# Patient Record
Sex: Male | Born: 1985 | Race: Black or African American | Marital: Married | Smoking: Current every day smoker
Health system: Southern US, Community
[De-identification: ages and names within clinical notes are randomized; demographics above are authoritative.]

## PROBLEM LIST (undated history)

## (undated) DIAGNOSIS — M549 Dorsalgia, unspecified: Secondary | ICD-10-CM

---

## 2015-09-22 ENCOUNTER — Encounter: Payer: Self-pay | Admitting: Emergency Medicine

## 2015-09-22 ENCOUNTER — Emergency Department
Admission: EM | Admit: 2015-09-22 | Discharge: 2015-09-22 | Disposition: A | Discharge status: Home / Self Care | Payer: No Typology Code available for payment source | Attending: Emergency Medicine | Admitting: Emergency Medicine

## 2015-09-22 ENCOUNTER — Emergency Department: Payer: No Typology Code available for payment source

## 2015-09-22 DIAGNOSIS — F17200 Nicotine dependence, unspecified, uncomplicated: Secondary | ICD-10-CM | POA: Diagnosis not present

## 2015-09-22 DIAGNOSIS — Y939 Activity, unspecified: Secondary | ICD-10-CM | POA: Diagnosis not present

## 2015-09-22 DIAGNOSIS — S39012A Strain of muscle, fascia and tendon of lower back, initial encounter: Secondary | ICD-10-CM | POA: Diagnosis not present

## 2015-09-22 DIAGNOSIS — M545 Low back pain: Secondary | ICD-10-CM | POA: Diagnosis present

## 2015-09-22 DIAGNOSIS — S161XXA Strain of muscle, fascia and tendon at neck level, initial encounter: Secondary | ICD-10-CM

## 2015-09-22 DIAGNOSIS — Y999 Unspecified external cause status: Secondary | ICD-10-CM | POA: Insufficient documentation

## 2015-09-22 DIAGNOSIS — Y92410 Unspecified street and highway as the place of occurrence of the external cause: Secondary | ICD-10-CM | POA: Diagnosis not present

## 2015-09-22 MED ORDER — CYCLOBENZAPRINE HCL 10 MG PO TABS: 10.0000 mg | ORAL_TABLET | Freq: Three times a day (TID) | ORAL | 0 refills | Status: DC | PRN

## 2015-09-22 MED ORDER — NAPROXEN 500 MG PO TABS: 500.0000 mg | ORAL_TABLET | Freq: Two times a day (BID) | ORAL | 0 refills | Status: DC
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
## 2015-09-22 NOTE — ED Notes (Signed)
Patient returned from xray.
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
## 2015-11-04 ENCOUNTER — Encounter: Payer: Self-pay | Admitting: *Deleted

## 2015-11-04 ENCOUNTER — Emergency Department
Admission: EM | Admit: 2015-11-04 | Discharge: 2015-11-04 | Disposition: A | Discharge status: Home / Self Care | Payer: No Typology Code available for payment source | Attending: Emergency Medicine | Admitting: Emergency Medicine

## 2015-11-04 DIAGNOSIS — M545 Low back pain, unspecified: Secondary | ICD-10-CM

## 2015-11-04 DIAGNOSIS — Y92410 Unspecified street and highway as the place of occurrence of the external cause: Secondary | ICD-10-CM | POA: Diagnosis not present

## 2015-11-04 DIAGNOSIS — F17200 Nicotine dependence, unspecified, uncomplicated: Secondary | ICD-10-CM | POA: Diagnosis not present

## 2015-11-04 DIAGNOSIS — Y999 Unspecified external cause status: Secondary | ICD-10-CM | POA: Diagnosis not present

## 2015-11-04 DIAGNOSIS — Y939 Activity, unspecified: Secondary | ICD-10-CM | POA: Insufficient documentation

## 2015-11-04 MED ORDER — BACLOFEN 10 MG PO TABS: 10.0000 mg | ORAL_TABLET | Freq: Three times a day (TID) | ORAL | 0 refills | Status: AC

## 2015-11-04 MED ORDER — NAPROXEN 500 MG PO TABS: 500.0000 mg | ORAL_TABLET | Freq: Two times a day (BID) | ORAL | 0 refills | Status: AC

## 2015-11-04 MED ORDER — NAPROXEN 500 MG PO TABS
500.0000 mg | ORAL_TABLET | Freq: Once | ORAL | Status: AC
  Administered 2015-11-04: 500 mg via ORAL
  Filled 2015-11-04: qty 1
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
## 2015-11-04 NOTE — ED Notes (Signed)
Pt given flexeril and naprosyn prescriptions in past but reports he can not afford medication at this time.
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
## 2015-12-03 ENCOUNTER — Encounter: Payer: Self-pay | Admitting: Emergency Medicine

## 2015-12-03 ENCOUNTER — Emergency Department
Admission: EM | Admit: 2015-12-03 | Discharge: 2015-12-03 | Disposition: A | Discharge status: Home / Self Care | Payer: Self-pay | Attending: Emergency Medicine | Admitting: Emergency Medicine

## 2015-12-03 DIAGNOSIS — F17200 Nicotine dependence, unspecified, uncomplicated: Secondary | ICD-10-CM | POA: Insufficient documentation

## 2015-12-03 DIAGNOSIS — R1010 Upper abdominal pain, unspecified: Secondary | ICD-10-CM

## 2015-12-03 DIAGNOSIS — R1011 Right upper quadrant pain: Secondary | ICD-10-CM | POA: Insufficient documentation

## 2015-12-03 DIAGNOSIS — Z791 Long term (current) use of non-steroidal anti-inflammatories (NSAID): Secondary | ICD-10-CM | POA: Insufficient documentation

## 2015-12-03 HISTORY — DX: Dorsalgia, unspecified: M54.9

## 2015-12-03 LAB — COMPREHENSIVE METABOLIC PANEL
ALBUMIN: 4.1 g/dL (ref 3.5–5.0)
ALT: 24 U/L (ref 17–63)
ANION GAP: 6 (ref 5–15)
AST: 32 U/L (ref 15–41)
Alkaline Phosphatase: 48 U/L (ref 38–126)
BUN: 13 mg/dL (ref 6–20)
CHLORIDE: 106 mmol/L (ref 101–111)
CO2: 25 mmol/L (ref 22–32)
Calcium: 9.4 mg/dL (ref 8.9–10.3)
Creatinine, Ser: 1.2 mg/dL (ref 0.61–1.24)
GFR calc Af Amer: >60 mL/min (ref >60)
GFR calc non Af Amer: >60 mL/min (ref >60)
GLUCOSE: 66 mg/dL (ref 65–99)
POTASSIUM: 3.5 mmol/L (ref 3.5–5.1)
SODIUM: 137 mmol/L (ref 135–145)
TOTAL PROTEIN: 7 g/dL (ref 6.5–8.1)
Total Bilirubin: 0.5 mg/dL (ref 0.3–1.2)

## 2015-12-03 LAB — CBC
HEMATOCRIT: 43.2 % (ref 40.0–52.0)
HEMOGLOBIN: 14.9 g/dL (ref 13.0–18.0)
MCH: 29.3 pg (ref 26.0–34.0)
MCHC: 34.4 g/dL (ref 32.0–36.0)
MCV: 85 fL (ref 80.0–100.0)
Platelets: 228 10*3/uL (ref 150–440)
RBC: 5.08 MIL/uL (ref 4.40–5.90)
RDW: 13.6 % (ref 11.5–14.5)
WBC: 7 10*3/uL (ref 3.8–10.6)

## 2015-12-03 LAB — URINALYSIS COMPLETE WITH MICROSCOPIC (ARMC ONLY)
BACTERIA UA: NONE SEEN
Bilirubin Urine: NEGATIVE
Glucose, UA: NEGATIVE mg/dL
Hgb urine dipstick: NEGATIVE
Ketones, ur: NEGATIVE mg/dL
Leukocytes, UA: NEGATIVE
NITRITE: NEGATIVE
PROTEIN: NEGATIVE mg/dL
RBC / HPF: NONE SEEN RBC/hpf (ref 0–5)
SPECIFIC GRAVITY, URINE: 1.019 (ref 1.005–1.030)
Squamous Epithelial / LPF: NONE SEEN
pH: 5 (ref 5.0–8.0)

## 2015-12-03 LAB — LIPASE, BLOOD: LIPASE: 19 U/L (ref 11–51)

## 2015-12-03 MED ORDER — DICYCLOMINE HCL 20 MG PO TABS: 20.0000 mg | ORAL_TABLET | Freq: Three times a day (TID) | ORAL | 0 refills | Status: AC | PRN

## 2015-12-03 MED ORDER — METOCLOPRAMIDE HCL 10 MG PO TABS: 10.0000 mg | ORAL_TABLET | Freq: Four times a day (QID) | ORAL | 0 refills | Status: AC | PRN

## 2015-12-03 NOTE — ED Triage Notes (Signed)
Pt presents to ED with generalized lower abdominal pain and NVD that began yesterday.

## 2015-12-03 NOTE — ED Triage Notes (Signed)
First Nurse:  C/O abdominal pain x 1 day.  1 Episode of vomiting today.  Patient posture upright and relaxed.  Ambulates with easy and steady gait.

## 2015-12-03 NOTE — ED Provider Notes (Signed)
[facility] Emergency Department Provider Note   ____________________________________________   First MD Initiated Contact with Patient 12/03/15 1426     (approximate)  I have reviewed the triage vital signs and the nursing notes.   HISTORY  Chief Complaint Abdominal Pain   HPI Joshua Cameron is a 30 y.o. male with a history of chronic back pain who is presenting to the emergency department today with vomiting, abdominal pain and diarrhea. He says that he had one episode of diarrhea and then vomited at 11 AM today. He says that he has been feeling nauseous and has a known sick contact of his boss. However, he says his nausea has passed at this time. Does not report any blood in his vomit or diarrhea. Says that he is in the emergency department at this time because of upper abdominal pain which she describes intermittent and cramping. He says that his pain is a 1 out of 10 now but when it increases can get into a 10 out of 10 and have him double over. He denies any cough or shortness of breath or runny nose. He says that he had chills at home. Does not report any recent use of antibiotics.   Past Medical History:  Diagnosis Date  . Back pain     There are no active problems to display for this patient.   History reviewed. No pertinent surgical history.  Prior to Admission medications   Medication Sig Start Date End Date Taking? Authorizing Provider  baclofen (LIORESAL) 10 MG tablet Take 1 tablet (10 mg total) by mouth 3 (three) times daily. 11/04/15   Chinita Pesterari B Triplett, FNP  naproxen (NAPROSYN) 500 MG tablet Take 1 tablet (500 mg total) by mouth 2 (two) times daily with a meal. 11/04/15   Chinita Pesterari B Triplett, FNP    Allergies Review of patient's allergies indicates no known allergies.  No family history on file.  Social History Social History  Substance Use Topics  . Smoking status: Current Every Day Smoker  . Smokeless tobacco: Never Used  . Alcohol use  Yes    Review of Systems Constitutional: chills Eyes: No visual changes. ENT: No sore throat. Cardiovascular: Denies chest pain. Respiratory: Denies shortness of breath. Gastrointestinal: No constipation. Genitourinary: Negative for dysuria. Musculoskeletal: Negative for back pain. Skin: Negative for rash. Neurological: Negative for headaches, focal weakness or numbness.  10-point ROS otherwise negative.  ____________________________________________   PHYSICAL EXAM:  VITAL SIGNS: ED Triage Vitals  Enc Vitals Group     BP 12/03/15 1226 130/75     Pulse Rate 12/03/15 1226 76     Resp 12/03/15 1226 18     Temp 12/03/15 1226 98.1 F (36.7 C)     Temp Source 12/03/15 1226 Oral     SpO2 12/03/15 1226 100 %     Weight 12/03/15 1226 166 lb (75.3 kg)     Height 12/03/15 1226 6' (1.829 m)     Head Circumference --      Peak Flow --      Pain Score 12/03/15 1231 2     Pain Loc --      Pain Edu? --      Excl. in GC? --     Constitutional: Alert and oriented. Well appearing and in no acute distress. Eyes: Conjunctivae are normal. PERRL. EOMI. Head: Atraumatic. Nose: No congestion/rhinnorhea. Mouth/Throat: Mucous membranes are moist.   Neck: No stridor.   Cardiovascular: Normal rate, regular rhythm. Grossly normal heart sounds.  Good peripheral circulation Bilateral, equal and intact radial as well as dorsalis pedis pulses. Respiratory: Normal respiratory effort.  No retractions. Lungs CTAB. Gastrointestinal: Soft with minimal tenderness across the upper abdomen. Negative Murphy sign. No distention.  No CVA tenderness. Musculoskeletal: No lower extremity tenderness nor edema.  No joint effusions. Neurologic:  Normal speech and language. No gross focal neurologic deficits are appreciated. No gait instability. Skin:  Skin is warm, dry and intact. No rash noted. Psychiatric: Mood and affect are normal. Speech and behavior are  normal.  ____________________________________________   LABS (all labs ordered are listed, but only abnormal results are displayed)  Labs Reviewed  URINALYSIS COMPLETEWITH MICROSCOPIC ([facility] ONLY) - Abnormal; Notable for the following:       Result Value   Color, Urine YELLOW (*)    APPearance CLEAR (*)    All other components within normal limits  LIPASE, BLOOD  COMPREHENSIVE METABOLIC PANEL  CBC   ____________________________________________  EKG   ____________________________________________  RADIOLOGY   ____________________________________________   PROCEDURES  Procedure(s) performed:   Procedures  Critical Care performed:   ____________________________________________   INITIAL IMPRESSION / ASSESSMENT AND PLAN / ED COURSE  Pertinent labs & imaging results that were available during my care of the patient were reviewed by me and considered in my medical decision making (see chart for details).  Patient with very reassuring blood work. Only minimal tenderness to the upper abdomen and what sounds like cramping abdominal pain. Low suspicion for surgical pathology. Suspect likely viral etiology. We'll treat symptomatically. Discussed return precautions with the patient including any worsening or concerning symptoms. He is understanding of the plan and willing to comply.     ____________________________________________   FINAL CLINICAL IMPRESSION(S) / ED DIAGNOSES  Upper abdominal pain with nausea vomiting and diarrhea.    NEW MEDICATIONS STARTED DURING THIS VISIT:  New Prescriptions   No medications on file     Note:  This document was prepared using Dragon voice recognition software and may include unintentional dictation errors.    Myrna Blazer, MD 12/03/15 [phone]
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
